# Patient Record
Sex: Male | Born: 1970 | Race: Black or African American | Hispanic: No | Marital: Married | Smoking: Never smoker
Health system: Southern US, Community
[De-identification: ages and names within clinical notes are randomized; demographics above are authoritative.]

## PROBLEM LIST (undated history)

## (undated) DIAGNOSIS — G839 Paralytic syndrome, unspecified: Secondary | ICD-10-CM

## (undated) DIAGNOSIS — Z789 Other specified health status: Secondary | ICD-10-CM

## (undated) HISTORY — PX: NO PAST SURGERIES: SHX2092

---

## 2004-03-27 ENCOUNTER — Emergency Department: Payer: Self-pay | Admitting: Emergency Medicine

## 2005-01-12 ENCOUNTER — Other Ambulatory Visit: Payer: Self-pay

## 2005-01-12 ENCOUNTER — Emergency Department: Payer: Self-pay | Admitting: Unknown Physician Specialty

## 2006-02-14 ENCOUNTER — Emergency Department: Payer: Self-pay | Admitting: Emergency Medicine

## 2006-07-22 ENCOUNTER — Emergency Department: Payer: Self-pay | Admitting: Emergency Medicine

## 2008-10-07 ENCOUNTER — Emergency Department: Payer: Self-pay | Admitting: Internal Medicine

## 2008-10-09 ENCOUNTER — Emergency Department: Payer: Self-pay | Admitting: Emergency Medicine

## 2012-12-13 ENCOUNTER — Emergency Department: Payer: Self-pay | Admitting: Emergency Medicine

## 2012-12-13 LAB — CBC
HCT: 43.1 % (ref 40.0–52.0)
HGB: 14.5 g/dL (ref 13.0–18.0)
MCH: 30.3 pg (ref 26.0–34.0)
MCHC: 33.7 g/dL (ref 32.0–36.0)
Platelet: 174 10*3/uL (ref 150–440)
RBC: 4.8 10*6/uL (ref 4.40–5.90)
RDW: 13.2 % (ref 11.5–14.5)

## 2012-12-13 LAB — CK TOTAL AND CKMB (NOT AT ARMC): CK, Total: 373 U/L — ABNORMAL HIGH (ref 35–232)

## 2012-12-13 LAB — BASIC METABOLIC PANEL
Calcium, Total: 9.1 mg/dL (ref 8.5–10.1)
EGFR (African American): >60
EGFR (Non-African Amer.): >60
Glucose: 111 mg/dL — ABNORMAL HIGH (ref 65–99)
Sodium: 140 mmol/L (ref 136–145)

## 2012-12-14 LAB — URINALYSIS, COMPLETE
Glucose,UR: NEGATIVE mg/dL (ref 0–75)
Ketone: NEGATIVE
Leukocyte Esterase: NEGATIVE
Ph: 5 (ref 4.5–8.0)
Protein: NEGATIVE
RBC,UR: 7 /HPF (ref 0–5)
Specific Gravity: 1.026 (ref 1.003–1.030)
Squamous Epithelial: <1
WBC UR: 1 /HPF (ref 0–5)

## 2012-12-14 LAB — DRUG SCREEN, URINE
Amphetamines, Ur Screen: NEGATIVE (ref ?–1000)
Barbiturates, Ur Screen: NEGATIVE (ref ?–200)
Cannabinoid 50 Ng, Ur ~~LOC~~: POSITIVE (ref ?–50)
Cocaine Metabolite,Ur ~~LOC~~: NEGATIVE (ref ?–300)
MDMA (Ecstasy)Ur Screen: NEGATIVE (ref ?–500)
Methadone, Ur Screen: NEGATIVE (ref ?–300)
Opiate, Ur Screen: NEGATIVE (ref ?–300)

## 2012-12-14 LAB — BASIC METABOLIC PANEL
Anion Gap: 9 (ref 7–16)
Calcium, Total: 7.9 mg/dL — ABNORMAL LOW (ref 8.5–10.1)
Co2: 24 mmol/L (ref 21–32)
Creatinine: 1.24 mg/dL (ref 0.60–1.30)
EGFR (African American): >60
EGFR (Non-African Amer.): >60
Osmolality: 287 (ref 275–301)
Sodium: 142 mmol/L (ref 136–145)

## 2013-11-30 ENCOUNTER — Emergency Department: Payer: Self-pay | Admitting: Emergency Medicine

## 2013-12-02 ENCOUNTER — Emergency Department: Payer: Self-pay | Admitting: Emergency Medicine

## 2013-12-03 LAB — BASIC METABOLIC PANEL
Anion Gap: 7 (ref 7–16)
BUN: 23 mg/dL — ABNORMAL HIGH (ref 7–18)
CALCIUM: 8.9 mg/dL (ref 8.5–10.1)
CO2: 27 mmol/L (ref 21–32)
Chloride: 103 mmol/L (ref 98–107)
Creatinine: 1.06 mg/dL (ref 0.60–1.30)
EGFR (Non-African Amer.): >60
GLUCOSE: 83 mg/dL (ref 65–99)
Osmolality: 277 (ref 275–301)
Potassium: 4.1 mmol/L (ref 3.5–5.1)
Sodium: 137 mmol/L (ref 136–145)

## 2013-12-03 LAB — CBC
HCT: 41.7 % (ref 40.0–52.0)
HGB: 14 g/dL (ref 13.0–18.0)
MCH: 30.9 pg (ref 26.0–34.0)
MCHC: 33.7 g/dL (ref 32.0–36.0)
MCV: 92 fL (ref 80–100)
PLATELETS: 139 10*3/uL — AB (ref 150–440)
RBC: 4.54 10*6/uL (ref 4.40–5.90)
RDW: 13.3 % (ref 11.5–14.5)
WBC: 6.3 10*3/uL (ref 3.8–10.6)

## 2013-12-03 LAB — CK: CK, Total: 1568 U/L — ABNORMAL HIGH

## 2013-12-19 ENCOUNTER — Ambulatory Visit: Payer: Self-pay | Admitting: Unknown Physician Specialty

## 2014-07-05 IMAGING — US US EXTREM LOW VENOUS*R*
1 series · 13 of 24 positions shown · non-contrast
Comparison: None.

CLINICAL DATA: Hamstring injury with swelling.  Pain.



[Series 1: us extrem low venous*right* · 0.07mm/px · 13 of 44 slices shown]
[im 1/44]
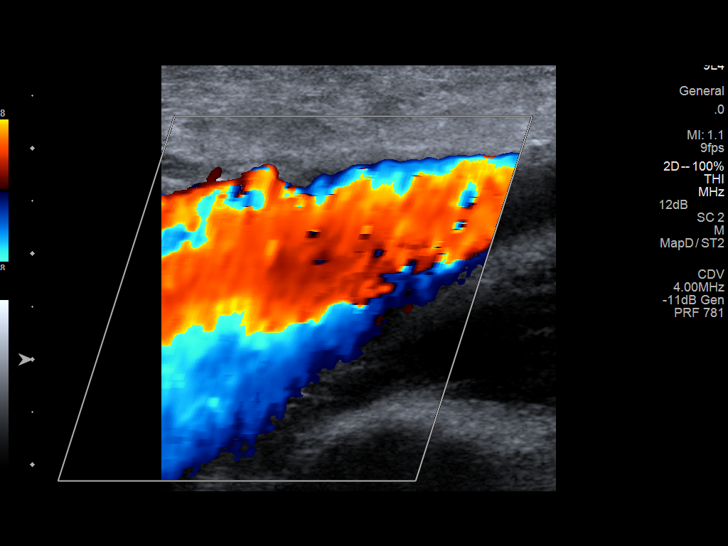
[im 4/44]
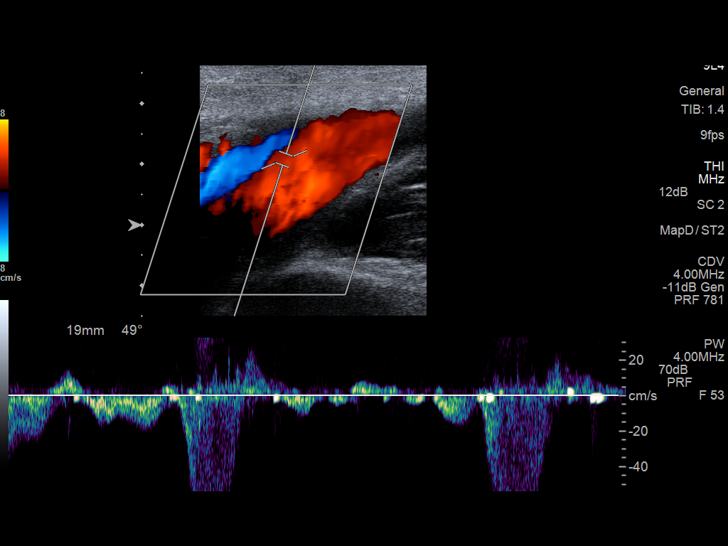
[im 8/44]
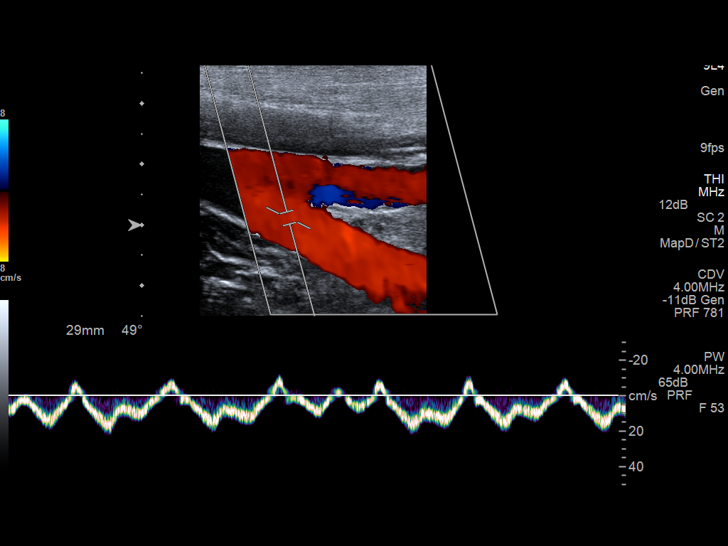
[im 12/44]
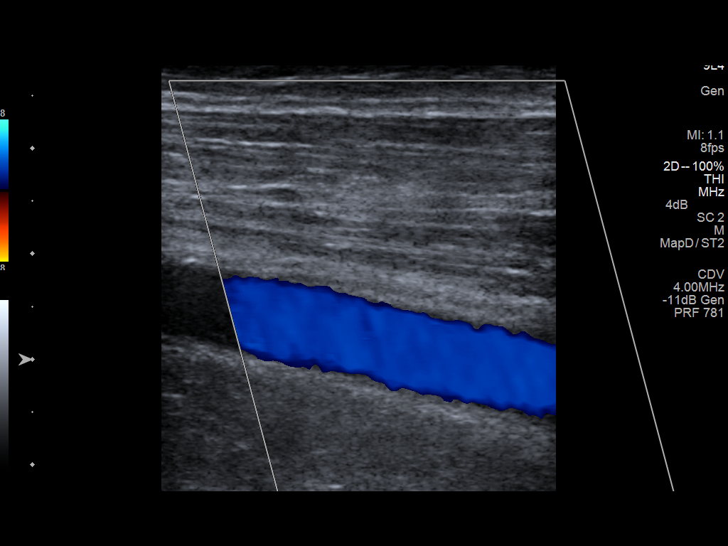
[im 15/44]
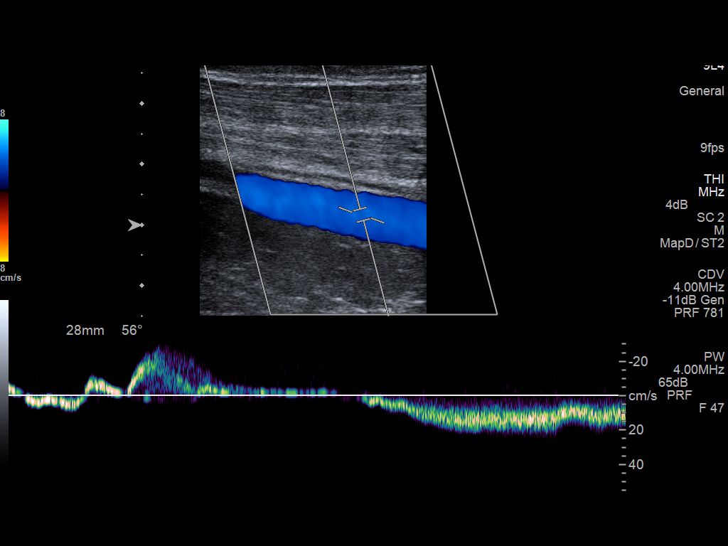
[im 19/44]
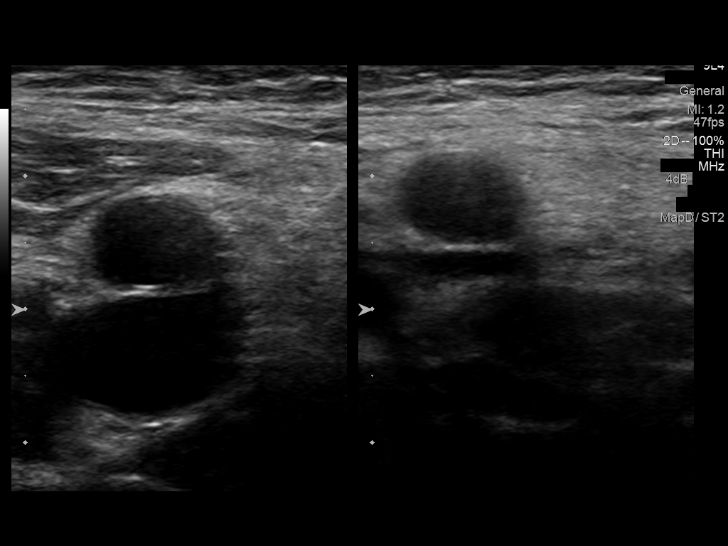
[im 23/44]
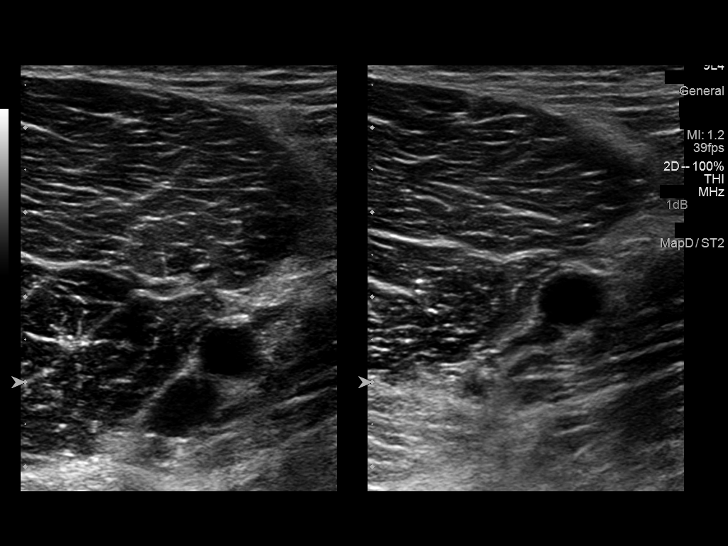
[im 25/44]
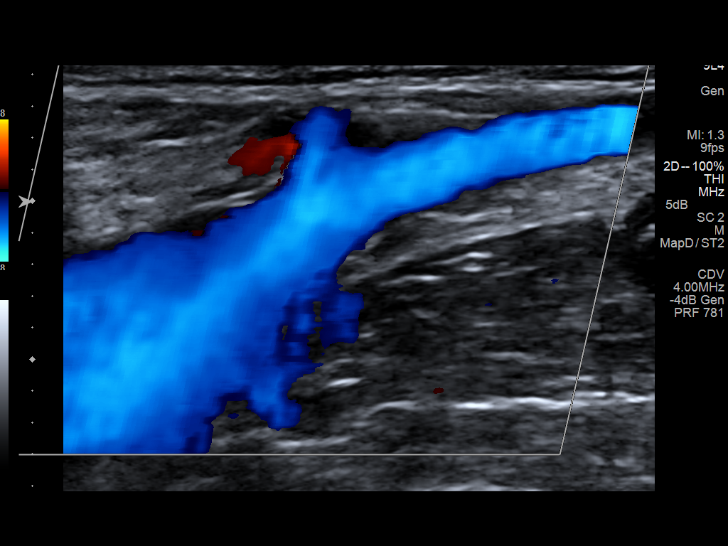
[im 29/44]
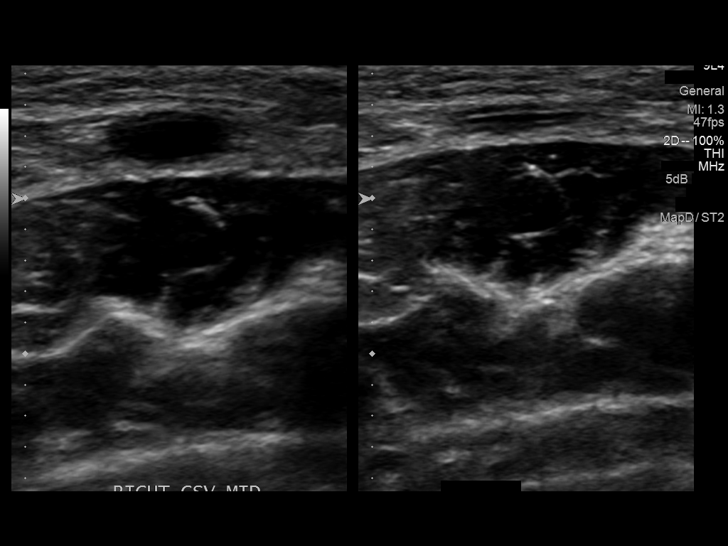
[im 32/44]
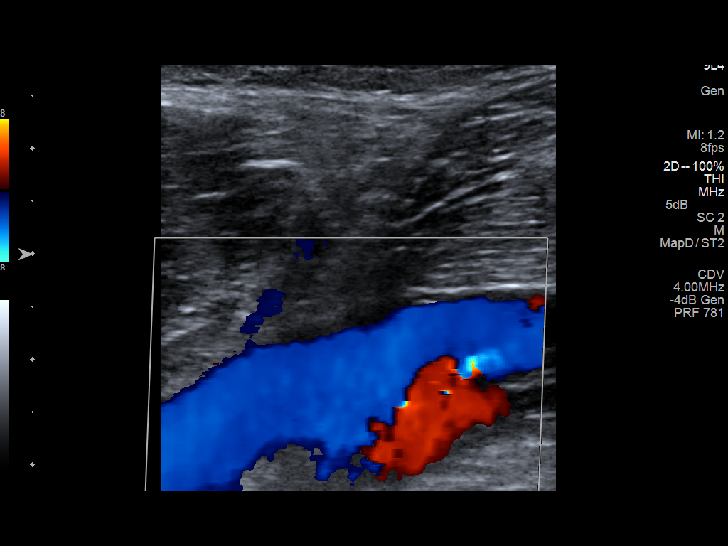
[im 36/44]
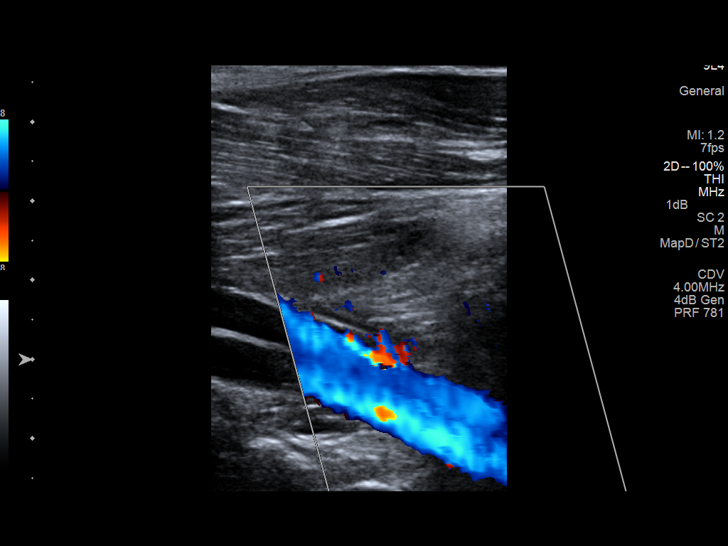
[im 40/44]
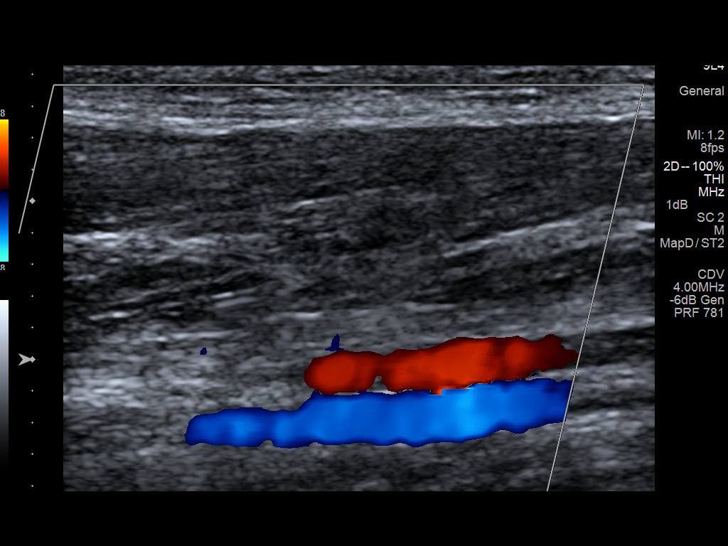
[im 44/44]
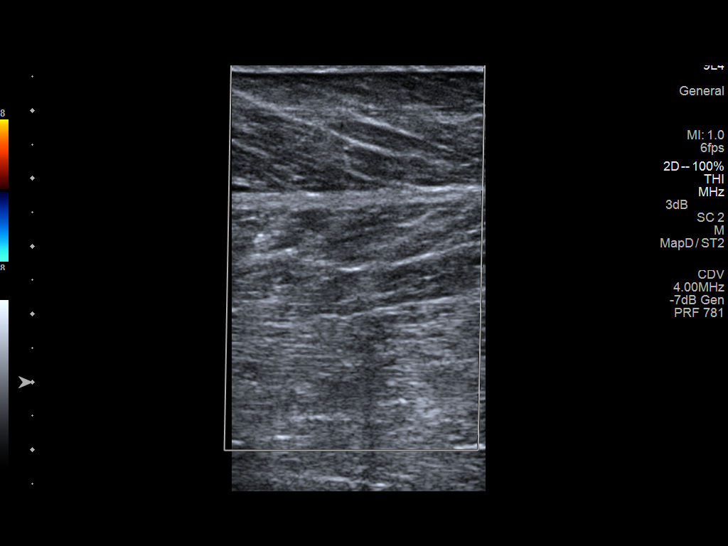

[13 of 24 positions shown; findings below may reference images not displayed]

FINDINGS: Common Femoral Vein: No evidence of thrombus. Normal
compressibility, respiratory phasicity and response to augmentation.

Saphenofemoral Junction: No evidence of thrombus. Normal
compressibility and flow on color Doppler imaging.

Profunda Femoral Vein: No evidence of thrombus. Normal
compressibility and flow on color Doppler imaging.

Femoral Vein: No evidence of thrombus. Normal compressibility,
respiratory phasicity and response to augmentation.

Popliteal Vein: No evidence of thrombus. Normal compressibility,
respiratory phasicity and response to augmentation.

Calf Veins: No evidence of thrombus. Normal compressibility and flow
on color Doppler imaging.

Superficial Great Saphenous Vein: No evidence of thrombus. Normal
compressibility and flow on color Doppler imaging.

Venous Reflux:  None.

Other Findings:  None.
IMPRESSION: No evidence of deep venous thrombosis.

## 2016-01-27 ENCOUNTER — Ambulatory Visit: Payer: Self-pay | Admitting: Podiatry

## 2019-11-12 ENCOUNTER — Ambulatory Visit (INDEPENDENT_AMBULATORY_CARE_PROVIDER_SITE_OTHER): Payer: 59 | Admitting: Podiatry

## 2019-11-12 ENCOUNTER — Encounter: Payer: Self-pay | Admitting: Podiatry

## 2019-11-12 ENCOUNTER — Other Ambulatory Visit: Payer: Self-pay

## 2019-11-12 ENCOUNTER — Encounter: Payer: Self-pay | Admitting: *Deleted

## 2019-11-12 DIAGNOSIS — B353 Tinea pedis: Secondary | ICD-10-CM

## 2019-11-12 DIAGNOSIS — L03119 Cellulitis of unspecified part of limb: Secondary | ICD-10-CM

## 2019-11-12 MED ORDER — CLINDAMYCIN HCL 150 MG PO CAPS: 150.0000 mg | ORAL_CAPSULE | Freq: Three times a day (TID) | ORAL | 1 refills | Status: DC

## 2019-11-12 MED ORDER — TERBINAFINE HCL 250 MG PO TABS: 250.0000 mg | ORAL_TABLET | Freq: Every day | ORAL | 0 refills | Status: DC

## 2019-11-12 NOTE — Progress Notes (Signed)
  Subjective:  Patient ID: Larry Knox, male    DOB: Jun 18, 1970,  MRN: [id number] HPI Chief Complaint  Patient presents with  . Skin Problem    Toes bilateral - scaly, rashy area x 3 weeks, episodes of this happens periodically, tried keeping clean, took clindamycin in the past  . New Patient (Initial Visit)    49 y.o. male presents with the above complaint.   ROS: Denies fever chills nausea vomiting muscle aches pains calf pain back pain chest pain shortness of breath.  No past medical history on file.   Current Outpatient Medications:  .  clindamycin (CLEOCIN) 150 MG capsule, Take 1 capsule (150 mg total) by mouth 3 (three) times daily., Disp: 30 capsule, Rfl: 1 .  terbinafine (LAMISIL) 250 MG tablet, Take 1 tablet (250 mg total) by mouth daily., Disp: 30 tablet, Rfl: 0  No Known Allergies Review of Systems Objective:  There were no vitals filed for this visit.  General: Well developed, nourished, in no acute distress, alert and oriented x3   Dermatological: Skin is warm, dry and supple bilateral. Nails x 10 are well maintained; remaining integument appears unremarkable at this time. There are no open sores, no preulcerative lesions, no rash or signs of infection present.  This appears to be an interdigital tinea pedis bilaterally there also appears to be some blistering associated with it possibly from the fungus but it does have more of a bacterial appearance to it.  The areas are warm to the touch as opposed to the surrounding skin but the majority of it is on the plantar aspect of the foot and the worst of it is between the toes and dorsum of the toes.  Vascular: Dorsalis Pedis artery and Posterior Tibial artery pedal pulses are 2/4 bilateral with immedate capillary fill time. Pedal hair growth present. No varicosities and no lower extremity edema present bilateral.   Neruologic: Grossly intact via light touch bilateral. Vibratory intact via tuning fork bilateral.  Protective threshold with Semmes Wienstein monofilament intact to all pedal sites bilateral. Patellar and Achilles deep tendon reflexes 2+ bilateral. No Babinski or clonus noted bilateral.   Musculoskeletal: No gross boney pedal deformities bilateral. No pain, crepitus, or limitation noted with foot and ankle range of motion bilateral. Muscular strength 5/5 in all groups tested bilateral.  Gait: Unassisted, Nonantalgic.    Radiographs:  None taken  Assessment & Plan:   Assessment: Most likely this is a tinea outbreak with a superimposed bacterial infection.  Plan: We discussed etiology pathology conservative versus surgical therapies.  At this point there was no wet exudate to take a sample left.  Grossly this appears to be bacteria and fungus combined.  At this point I started him on clindamycin and terbinafine.  I will follow-up with him in 1 month.     Larry Sequeira T. [place], [place]

## 2019-11-25 ENCOUNTER — Ambulatory Visit: Payer: 59 | Admitting: Podiatry

## 2019-11-27 ENCOUNTER — Ambulatory Visit (INDEPENDENT_AMBULATORY_CARE_PROVIDER_SITE_OTHER): Payer: 59 | Admitting: Podiatry

## 2019-11-27 ENCOUNTER — Encounter: Payer: Self-pay | Admitting: Podiatry

## 2019-11-27 ENCOUNTER — Other Ambulatory Visit: Payer: Self-pay

## 2019-11-27 DIAGNOSIS — B353 Tinea pedis: Secondary | ICD-10-CM

## 2019-11-27 DIAGNOSIS — L03119 Cellulitis of unspecified part of limb: Secondary | ICD-10-CM

## 2019-11-27 MED ORDER — CLINDAMYCIN HCL 150 MG PO CAPS: 150.0000 mg | ORAL_CAPSULE | Freq: Three times a day (TID) | ORAL | 1 refills | Status: DC

## 2019-11-27 MED ORDER — TERBINAFINE HCL 250 MG PO TABS: 250.0000 mg | ORAL_TABLET | Freq: Every day | ORAL | 0 refills | Status: DC

## 2019-11-28 ENCOUNTER — Encounter: Payer: Self-pay | Admitting: Podiatry

## 2019-11-28 NOTE — Progress Notes (Signed)
  Subjective:  Patient ID: Larry Knox, male    DOB: 21-Dec-1970,  MRN: [id number]  Chief Complaint  Patient presents with  . Foot Problem    both feet are a little better and the medicines seems to be helping    49 y.o. male presents with the above complaint.  Patient presents with a follow-up of dermatological rash/eczema bilateral feet dorsal aspect of the foot.  Patient states is doing little bit better.  Patient states that he was given clindamycin as well as Lamisil by Dr. Al Corpus which seem to have improved a little bit.  Patient states that he has been better since Monday.  His pain has gotten little bit better.  The itching is not as bad as it used to.  Patient has been wearing his or his boots.  He has been try to keep it nice and dry.  He denies any other acute complaints.  He would like to know what his further managements are.   Review of Systems: Negative except as noted in the HPI. Denies N/V/F/Ch.  History reviewed. No pertinent past medical history.  Current Outpatient Medications:  .  clindamycin (CLEOCIN) 150 MG capsule, Take 1 capsule (150 mg total) by mouth 3 (three) times daily., Disp: 30 capsule, Rfl: 1 .  terbinafine (LAMISIL) 250 MG tablet, Take 1 tablet (250 mg total) by mouth daily., Disp: 30 tablet, Rfl: 0  Social History   Tobacco Use  Smoking Status Never Smoker  Smokeless Tobacco Never Used    No Known Allergies Objective:  There were no vitals filed for this visit. There is no height or weight on file to calculate BMI. Constitutional Well developed. Well nourished.  Vascular Dorsalis pedis pulses palpable bilaterally. Posterior tibial pulses palpable bilaterally. Capillary refill normal to all digits.  No cyanosis or clubbing noted. Pedal hair growth normal.  Neurologic Normal speech. Oriented to person, place, and time. Epicritic sensation to light touch grossly present bilaterally.  Dermatologic  xerotic patches of dry subjective itching  skin noted to dorsal foot of the right as well as the dorsal lateral foot on the left side.  Mild raised papules noted.  Orthopedic: Normal joint ROM without pain or crepitus bilaterally. No visible deformities. No bony tenderness.   Radiographs: None Assessment:   1. Tinea pedis of both feet   2. Cellulitis of foot    Plan:  Patient was evaluated and treated and all questions answered.  Tinea outbreak with superimposed bacterial infection improving -I explained the patient the etiology of superimposed bacterial infection in the setting of tinea pedis and various treatment options were discussed.  Patient is doing well on Lamisil clindamycin therapy.  I will continue another month course of Lamisil therapy as well as give him another 10 days of clindamycin.  He will follow back up with Dr. Al Corpus for further management.  Even that this is working we will continue for another month course.  No follow-ups on file.

## 2019-12-17 ENCOUNTER — Other Ambulatory Visit: Payer: Self-pay

## 2019-12-17 ENCOUNTER — Encounter: Payer: Self-pay | Admitting: Podiatry

## 2019-12-17 ENCOUNTER — Ambulatory Visit (INDEPENDENT_AMBULATORY_CARE_PROVIDER_SITE_OTHER): Payer: 59 | Admitting: Podiatry

## 2019-12-17 DIAGNOSIS — B353 Tinea pedis: Secondary | ICD-10-CM | POA: Diagnosis not present

## 2019-12-17 DIAGNOSIS — Z79899 Other long term (current) drug therapy: Secondary | ICD-10-CM | POA: Diagnosis not present

## 2019-12-17 MED ORDER — CLINDAMYCIN PHOSPHATE 1 % EX SOLN
Freq: Two times a day (BID) | CUTANEOUS | 2 refills | Status: DC
Start: 2019-12-17 — End: 2022-09-18

## 2019-12-17 NOTE — Progress Notes (Signed)
He presents today states that he is doing much better than he was he still taking the Lamisil is still taking the oral clindamycin.  Objective: His tinea pedis appears to be resolving very nicely however he still has the digital maceration which appears to be more of a corynebacteria or erythrasma.  Assessment: Tinea pedis and erythrasma.  Plan: Continue the antifungal Lamisil that he has at home and we will start with a liquid clindamycin solution to drop into the toes on to see if that will help.  Hopefully this will help resolve his issues.  I am going to request a liver profile and I will follow-up with him in the near future.  Should this come back abnormal we will discuss that immediately.

## 2019-12-23 LAB — COMPREHENSIVE METABOLIC PANEL
ALT: 30 IU/L (ref 0–44)
AST: 21 IU/L (ref 0–40)
Albumin/Globulin Ratio: 1.7 (ref 1.2–2.2)
Albumin: 4.3 g/dL (ref 4.0–5.0)
Alkaline Phosphatase: 67 IU/L (ref 48–121)
BUN/Creatinine Ratio: 17 (ref 9–20)
BUN: 22 mg/dL (ref 6–24)
Bilirubin Total: 0.5 mg/dL (ref 0.0–1.2)
CO2: 21 mmol/L (ref 20–29)
Calcium: 9.1 mg/dL (ref 8.7–10.2)
Chloride: 104 mmol/L (ref 96–106)
Creatinine, Ser: 1.27 mg/dL (ref 0.76–1.27)
GFR calc Af Amer: 76 mL/min/{1.73_m2} (ref >59)
GFR calc non Af Amer: 65 mL/min/{1.73_m2} (ref >59)
Globulin, Total: 2.5 g/dL (ref 1.5–4.5)
Glucose: 96 mg/dL (ref 65–99)
Potassium: 4.5 mmol/L (ref 3.5–5.2)
Sodium: 138 mmol/L (ref 134–144)
Total Protein: 6.8 g/dL (ref 6.0–8.5)

## 2020-01-01 ENCOUNTER — Encounter: Payer: Self-pay | Admitting: Podiatry

## 2020-01-01 ENCOUNTER — Ambulatory Visit (INDEPENDENT_AMBULATORY_CARE_PROVIDER_SITE_OTHER): Payer: 59 | Admitting: Podiatry

## 2020-01-01 ENCOUNTER — Other Ambulatory Visit: Payer: Self-pay

## 2020-01-01 DIAGNOSIS — Z79899 Other long term (current) drug therapy: Secondary | ICD-10-CM | POA: Diagnosis not present

## 2020-01-01 DIAGNOSIS — B353 Tinea pedis: Secondary | ICD-10-CM

## 2020-01-01 DIAGNOSIS — L309 Dermatitis, unspecified: Secondary | ICD-10-CM | POA: Diagnosis not present

## 2020-01-02 ENCOUNTER — Telehealth: Payer: Self-pay

## 2020-01-02 DIAGNOSIS — L309 Dermatitis, unspecified: Secondary | ICD-10-CM

## 2020-01-02 DIAGNOSIS — B353 Tinea pedis: Secondary | ICD-10-CM

## 2020-01-02 DIAGNOSIS — L03119 Cellulitis of unspecified part of limb: Secondary | ICD-10-CM

## 2020-01-02 NOTE — Progress Notes (Signed)
  Subjective:  Patient ID: Larry Knox, male    DOB: July 05, 1970,  MRN: [id number]  Chief Complaint  Patient presents with  . Tinea Pedis    "its not getting any better, it hurts, swells, drains and is turning redder"    49 y.o. male presents with the above complaint.  Patient presents with a follow-up of of rash/eczema to the bilateral dorsal aspect of the foot with right side is much worse than left side.  There is maceration present.  Patient states the Lamisil or antibiotics is not helping at all.  He would like to know if there is anything else that could be done.  He has failed many prescription creams as well as oral medication.  He denies any other acute complaints.   Review of Systems: Negative except as noted in the HPI. Denies N/V/F/Ch.  No past medical history on file.  Current Outpatient Medications:  .  clindamycin (CLEOCIN T) 1 % external solution, Apply topically 2 (two) times daily., Disp: 60 mL, Rfl: 2 .  terbinafine (LAMISIL) 250 MG tablet, Take 1 tablet (250 mg total) by mouth daily., Disp: 30 tablet, Rfl: 0  Social History   Tobacco Use  Smoking Status Never Smoker  Smokeless Tobacco Never Used    No Known Allergies Objective:  There were no vitals filed for this visit. There is no height or weight on file to calculate BMI. Constitutional Well developed. Well nourished.  Vascular Dorsalis pedis pulses palpable bilaterally. Posterior tibial pulses palpable bilaterally. Capillary refill normal to all digits.  No cyanosis or clubbing noted. Pedal hair growth normal.  Neurologic Normal speech. Oriented to person, place, and time. Epicritic sensation to light touch grossly present bilaterally.  Dermatologic  xerotic patches of dry subjective itching skin noted to dorsal foot of the right as well as the dorsal lateral foot on the left side.  Mild raised papules noted.  Orthopedic: Normal joint ROM without pain or crepitus bilaterally. No visible  deformities. No bony tenderness.   Radiographs: None Assessment:   1. Tinea pedis of both feet   2. Encounter for long-term (current) use of medications   3. Dermatitis    Plan:  Patient was evaluated and treated and all questions answered.  Tinea outbreak with superimposed bacterial infection improving right side much worse than left side -Clinically appears that this is getting worse.  I am worried that there might be underlying component of dermatological manifestation that is not being controlled with antibiotics as well as antifungal such as Lamisil.  Given that this is clinically not improving I believe patient will benefit from follow-up with dermatologist for further evaluation and management. -He will benefit from Betadine wet-to-dry dressing changes as there is more maceration present.  I have asked him to do it 3 times a week. -There may be a component of psoriasis that patient will need to be worked up for.  Patient states understanding he will make an appointment with dermatology.  No follow-ups on file.

## 2020-01-02 NOTE — Telephone Encounter (Signed)
-----   Message from Candelaria Stagers, DPM sent at 01/02/2020  9:00 AM EDT ----- Regarding: Follow-up appointment for dermatology ASAP Hi Angie,  Can you make an appointment for this patient to follow-up with dermatology ASAP.   Thank you

## 2020-01-02 NOTE — Telephone Encounter (Signed)
Referral has been faxed to [facility]

## 2020-01-07 ENCOUNTER — Ambulatory Visit: Payer: 59 | Admitting: Podiatry

## 2020-01-19 ENCOUNTER — Telehealth: Payer: Self-pay

## 2020-01-19 NOTE — Telephone Encounter (Signed)
[facility] to follow up on patient's appt.  Per Noreene Larsson, on 01/13/2020 patient stated that he no longer wants the appt.

## 2020-01-21 ENCOUNTER — Ambulatory Visit: Payer: 59 | Admitting: Podiatry

## 2022-09-18 ENCOUNTER — Ambulatory Visit: Payer: 59 | Admitting: Nurse Practitioner

## 2022-09-18 ENCOUNTER — Other Ambulatory Visit: Payer: Self-pay

## 2022-09-18 ENCOUNTER — Telehealth: Payer: Self-pay

## 2022-09-18 ENCOUNTER — Encounter: Payer: Self-pay | Admitting: Nurse Practitioner

## 2022-09-18 VITALS — BP 120/72 | HR 70 | Temp 97.8°F | Resp 16 | Ht 69.25 in | Wt 200.1 lb

## 2022-09-18 DIAGNOSIS — E663 Overweight: Secondary | ICD-10-CM | POA: Diagnosis not present

## 2022-09-18 DIAGNOSIS — Z0000 Encounter for general adult medical examination without abnormal findings: Secondary | ICD-10-CM

## 2022-09-18 DIAGNOSIS — Z125 Encounter for screening for malignant neoplasm of prostate: Secondary | ICD-10-CM

## 2022-09-18 DIAGNOSIS — Z1211 Encounter for screening for malignant neoplasm of colon: Secondary | ICD-10-CM

## 2022-09-18 DIAGNOSIS — Z23 Encounter for immunization: Secondary | ICD-10-CM | POA: Diagnosis not present

## 2022-09-18 LAB — LIPID PANEL
Cholesterol: 235 mg/dL — ABNORMAL HIGH (ref 0–200)
HDL: 60.2 mg/dL (ref >39.00)
LDL Cholesterol: 152 mg/dL — ABNORMAL HIGH (ref 0–99)
NonHDL: 175.04
Total CHOL/HDL Ratio: 4
Triglycerides: 113 mg/dL (ref 0.0–149.0)
VLDL: 22.6 mg/dL (ref 0.0–40.0)

## 2022-09-18 LAB — COMPREHENSIVE METABOLIC PANEL
ALT: 50 U/L (ref 0–53)
AST: 33 U/L (ref 0–37)
Albumin: 4.5 g/dL (ref 3.5–5.2)
Alkaline Phosphatase: 59 U/L (ref 39–117)
BUN: 16 mg/dL (ref 6–23)
CO2: 28 mEq/L (ref 19–32)
Calcium: 9.3 mg/dL (ref 8.4–10.5)
Chloride: 103 mEq/L (ref 96–112)
Creatinine, Ser: 1.22 mg/dL (ref 0.40–1.50)
GFR: 67.98 mL/min (ref >60.00)
Glucose, Bld: 80 mg/dL (ref 70–99)
Potassium: 4.2 mEq/L (ref 3.5–5.1)
Sodium: 139 mEq/L (ref 135–145)
Total Bilirubin: 0.9 mg/dL (ref 0.2–1.2)
Total Protein: 6.6 g/dL (ref 6.0–8.3)

## 2022-09-18 LAB — TSH: TSH: 1.64 u[IU]/mL (ref 0.35–5.50)

## 2022-09-18 LAB — CBC
HCT: 45.6 % (ref 39.0–52.0)
Hemoglobin: 15.6 g/dL (ref 13.0–17.0)
MCHC: 34.3 g/dL (ref 30.0–36.0)
MCV: 90.2 fl (ref 78.0–100.0)
Platelets: 164 10*3/uL (ref 150.0–400.0)
RBC: 5.06 Mil/uL (ref 4.22–5.81)
RDW: 13.1 % (ref 11.5–15.5)
WBC: 4.2 10*3/uL (ref 4.0–10.5)

## 2022-09-18 LAB — PSA: PSA: 1.07 ng/mL (ref 0.10–4.00)

## 2022-09-18 MED ORDER — NA SULFATE-K SULFATE-MG SULF 17.5-3.13-1.6 GM/177ML PO SOLN: 1.0000 | Freq: Once | ORAL | 0 refills | Status: AC

## 2022-09-18 NOTE — Assessment & Plan Note (Signed)
Discussed age-appropriate immunizations and screening exams.  Patient is up-to-date on vaccinations.  First shingles vaccine is due today in office.  Did review patient's personal, surgical, social, family history.  Ambulatory referral placed for CRC screening.  DRE and PSA done today in office per patient request.  Patient was given information at discharge about preventative healthcare maintenance with anticipatory guidance.

## 2022-09-18 NOTE — Patient Instructions (Signed)
Nice to see you today I will be in touch with the labs once I have them Follow up with me in 1 year for your next physical and labs Make a nurse visit in 3 months for your second shingles vaccine  Make an appointment with Dr. Karleen Hampshire Copland about the shoulder and hip

## 2022-09-18 NOTE — Progress Notes (Signed)
New Patient Office Visit  Subjective    Patient ID: Larry Logearion Welby Sliker, male    DOB: [birth year]  Age: 52 y.o. MRN: [id number]  CC:  Chief Complaint  Patient presents with   Establish Care   Annual Exam    HPI Larry Knox presents to establish care   for complete physical and follow up of chronic conditions.  Immunizations: -Tetanus: Completed in within 10 years -Influenza: refused -Shingles: First vaccine today -Pneumonia: Too young -covid: Radiographer, therapeuticpfizer x2  Diet: Fair diet. States that he eats approx 2-3 meals a day. State breakfast for sure. Light lunch and dinner. States that he does snack. States that he will do black coffee, green tea, water and body armor  Exercise: No regular exercise. States on the weekends he will do bike treadmill and lifting weights   Eye exam: Completes annually. Glasses   Dental exam: has appt coming up in June   Colonoscopy: needs referral Lung Cancer Screening: N/A  PSA: Due  Sleep: states that he goes to bed around 9pm and will sleep 6-8 hours. Feels rested. Does not snore     Outpatient Encounter Medications as of 09/18/2022  Medication Sig   [DISCONTINUED] clindamycin (CLEOCIN T) 1 % external solution Apply topically 2 (two) times daily. (Patient not taking: Reported on 09/18/2022)   [DISCONTINUED] terbinafine (LAMISIL) 250 MG tablet Take 1 tablet (250 mg total) by mouth daily. (Patient not taking: Reported on 09/18/2022)   No facility-administered encounter medications on file as of 09/18/2022.    History reviewed. No pertinent past medical history.  History reviewed. No pertinent surgical history.  Family History  Problem Relation Age of Onset   Diabetes Mother    Heart disease Father    Diabetes Father    Diabetes Maternal Grandmother    Heart disease Paternal Grandmother    Heart disease Paternal Grandfather     Social History   Socioeconomic History   Marital status: Married    Spouse name: Danielle   Number of  children: 1   Years of education: Not on file   Highest education level: Not on file  Occupational History   Not on file  Tobacco Use   Smoking status: Never   Smokeless tobacco: Never  Vaping Use   Vaping Use: Never used  Substance and Sexual Activity   Alcohol use: Yes    Alcohol/week: 2.0 standard drinks of alcohol    Types: 2 Shots of liquor per week    Comment: bourbon on the weekends 2-3 shots over the weekend   Drug use: Never   Sexual activity: Yes    Birth control/protection: None  Other Topics Concern   Not on file  Social History Narrative   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  Respiratory:  Negative for shortness of breath.   Cardiovascular:  Negative for chest pain and leg swelling.  Gastrointestinal:  Negative for abdominal pain, blood in stool, constipation, diarrhea, nausea and vomiting.       Bm daily   Genitourinary:  Negative for dysuria and  hematuria.       Nocturia 1-2   Neurological:  Negative for tingling and headaches.  Psychiatric/Behavioral:  Negative for hallucinations and suicidal ideas.         Objective    BP 120/72   Pulse 70   Temp 97.8 F (36.6 C)   Resp 16   Ht 5' 9.25" (1.759 m)   Wt 200 lb 2 oz (90.8 kg)   SpO2 97%   BMI 29.34 kg/m   Physical Exam Vitals and nursing note reviewed. Exam conducted with a chaperone present Tresa Endo Fiscal, CMA).  Constitutional:      Appearance: Normal appearance.  HENT:     Right Ear: Tympanic membrane, ear canal and external ear normal.     Left Ear: Tympanic membrane, ear canal and external ear normal.      Mouth/Throat:     Mouth: Mucous membranes are moist.     Pharynx: Oropharynx is clear.  Eyes:     Extraocular Movements: Extraocular movements intact.     Pupils: Pupils are equal, round, and reactive to light.  Cardiovascular:     Rate and Rhythm: Normal rate and regular rhythm.     Pulses: Normal pulses.     Heart sounds: Normal heart sounds.  Pulmonary:     Effort: Pulmonary effort is normal.     Breath sounds: Normal breath sounds.  Abdominal:     General: Bowel sounds are normal. There is no distension.     Palpations: There is no mass.     Tenderness: There is no abdominal tenderness.     Hernia: No hernia is present.  Genitourinary:    Prostate: Normal.     Rectum: Normal.  Musculoskeletal:     Right lower leg: No edema.     Left lower leg: No edema.  Lymphadenopathy:     Cervical: No cervical adenopathy.  Skin:    General: Skin is warm.  Neurological:     General: No focal deficit present.     Mental Status: He is alert.     Deep Tendon Reflexes:     Reflex Scores:      Bicep reflexes are 2+ on the right side and 2+ on the left side.      Patellar reflexes are 2+ on the right side and 2+ on the left side.    Comments: Bilateral upper and lower extremity strength 5/5  Psychiatric:        Mood and Affect: Mood normal.        Behavior: Behavior normal.        Thought Content: Thought content normal.        Judgment: Judgment normal.         Assessment & Plan:   Problem List Items Addressed This Visit       Other   Preventative health care - Primary    Discussed age-appropriate immunizations and screening exams.  Patient is up-to-date on vaccinations.  First shingles vaccine is due today in office.  Did review patient's personal, surgical, social, family history.  Ambulatory referral placed for CRC screening.  DRE and PSA done today in office per patient request.  Patient was given information at discharge about preventative healthcare maintenance with  anticipatory guidance.      Relevant Orders   CBC   Comprehensive metabolic panel   TSH   Overweight    Patient seems to be in great shape do think BMI elevated due to muscle mass.  Continue with  lifestyle modifications as it is      Relevant Orders   Lipid panel   Other Visit Diagnoses     Screening for colon cancer       Relevant Orders   Ambulatory referral to Gastroenterology   Screening for prostate cancer       Relevant Orders   PSA   Need for shingles vaccine       Relevant Orders   Zoster Recombinant (Shingrix ) (Completed)       Return in about 1 year (around 09/18/2023) for CPE and Labs.   Audria Nine, NP

## 2022-09-18 NOTE — Telephone Encounter (Signed)
Gastroenterology Pre-Procedure Review  Request Date: 10/16/22 Requesting Physician: Dr. Allegra Lai  PATIENT REVIEW QUESTIONS: The patient responded to the following health history questions as indicated:    1. Are you having any GI issues? no 2. Do you have a personal history of Polyps? no 3. Do you have a family history of Colon Cancer or Polyps? no 4. Diabetes Mellitus? no 5. Joint replacements in the past 12 months?no 6. Major health problems in the past 3 months?no 7. Any artificial heart valves, MVP, or defibrillator?no    MEDICATIONS & ALLERGIES:    Patient reports the following regarding taking any anticoagulation/antiplatelet therapy:   Plavix, Coumadin, Eliquis, Xarelto, Lovenox, Pradaxa, Brilinta, or Effient? no Aspirin? no  Patient confirms/reports the following medications:  No current outpatient medications on file.   No current facility-administered medications for this visit.    Patient confirms/reports the following allergies:  No Known Allergies  No orders of the defined types were placed in this encounter.   AUTHORIZATION INFORMATION Primary Insurance: 1D#: Group #:  Secondary Insurance: 1D#: Group #:  SCHEDULE INFORMATION: Date: 10/16/22 Time: Location: [facility]

## 2022-09-18 NOTE — Assessment & Plan Note (Signed)
Patient seems to be in great shape do think BMI elevated due to muscle mass.  Continue with lifestyle modifications as it is

## 2022-09-25 ENCOUNTER — Telehealth: Payer: Self-pay

## 2022-09-25 NOTE — Telephone Encounter (Signed)
Pt left message to cancel and reschedule colonoscopy ?

## 2022-09-25 NOTE — Telephone Encounter (Signed)
Returned patients call. LVM for pt to return my call.   Thanks,  Ryzen Deady, CMA 

## 2022-09-25 NOTE — Telephone Encounter (Signed)
Spoken to patient and we have reschedule to 11/13/2022 as requested.  New instructions will be sent.  Patient verbalized understanding.

## 2022-09-25 NOTE — Telephone Encounter (Signed)
Message left for patient to return my call.  

## 2022-11-10 ENCOUNTER — Encounter: Payer: Self-pay | Admitting: Gastroenterology

## 2022-11-13 ENCOUNTER — Ambulatory Visit
Admission: RE | Admit: 2022-11-13 | Discharge: 2022-11-13 | Disposition: A | Discharge status: Home / Self Care | Payer: 59 | Source: Ambulatory Visit | Attending: Gastroenterology | Admitting: Gastroenterology

## 2022-11-13 ENCOUNTER — Ambulatory Visit: Payer: 59 | Admitting: Anesthesiology

## 2022-11-13 ENCOUNTER — Other Ambulatory Visit: Payer: Self-pay

## 2022-11-13 ENCOUNTER — Encounter
Admission: RE | Disposition: A | Discharge status: Home / Self Care | Payer: Self-pay | Source: Ambulatory Visit | Attending: Gastroenterology

## 2022-11-13 ENCOUNTER — Encounter: Payer: Self-pay | Admitting: Gastroenterology

## 2022-11-13 DIAGNOSIS — Z1211 Encounter for screening for malignant neoplasm of colon: Secondary | ICD-10-CM | POA: Diagnosis not present

## 2022-11-13 DIAGNOSIS — K635 Polyp of colon: Secondary | ICD-10-CM | POA: Diagnosis not present

## 2022-11-13 DIAGNOSIS — D125 Benign neoplasm of sigmoid colon: Secondary | ICD-10-CM | POA: Diagnosis not present

## 2022-11-13 DIAGNOSIS — Z7689 Persons encountering health services in other specified circumstances: Secondary | ICD-10-CM | POA: Diagnosis not present

## 2022-11-13 HISTORY — PX: COLONOSCOPY WITH PROPOFOL: SHX5780

## 2022-11-13 HISTORY — DX: Paralytic syndrome, unspecified: G83.9

## 2022-11-13 HISTORY — DX: Other specified health status: Z78.9

## 2022-11-13 SURGERY — COLONOSCOPY WITH PROPOFOL
Anesthesia: General

## 2022-11-13 MED ORDER — LIDOCAINE HCL (CARDIAC) PF 100 MG/5ML IV SOSY
PREFILLED_SYRINGE | INTRAVENOUS | Status: DC | PRN
  Administered 2022-11-13: 100 mg via INTRAVENOUS

## 2022-11-13 MED ORDER — PROPOFOL 500 MG/50ML IV EMUL
INTRAVENOUS | Status: DC | PRN
  Administered 2022-11-13: 220.507 ug/kg/min via INTRAVENOUS

## 2022-11-13 MED ORDER — PROPOFOL 10 MG/ML IV BOLUS
INTRAVENOUS | Status: DC | PRN
  Administered 2022-11-13: 100 mg via INTRAVENOUS

## 2022-11-13 MED ORDER — SODIUM CHLORIDE 0.9 % IV SOLN: INTRAVENOUS | Status: DC

## 2022-11-13 NOTE — Op Note (Signed)
[facility] Gastroenterology Patient Name: Larry Knox Procedure Date: 11/13/2022 8:26 AM MRN: [id number] Account #: [id number] Date of Birth: Mar 31, 1971 Admit Type: Inpatient Age: 52 Room: [facility] ENDO ROOM 4 Gender: Male Note Status: Finalized Instrument Name: Nelda Marseille [id number] Procedure:             Colonoscopy Indications:           Screening for colorectal malignant neoplasm, This is                         the patient's first colonoscopy Providers:             Toney Reil MD, MD Referring MD:          Genene Churn. Toney Reil (Referring MD) Medicines:             General Anesthesia Complications:         No immediate complications. Estimated blood loss: None. Procedure:             Pre-Anesthesia Assessment:                        - Prior to the procedure, a History and Physical was                         performed, and patient medications and allergies were                         reviewed. The patient is competent. The risks and                         benefits of the procedure and the sedation options and                         risks were discussed with the patient. All questions                         were answered and informed consent was obtained.                         Patient identification and proposed procedure were                         verified by the physician, the nurse, the                         anesthesiologist, the anesthetist and the technician                         in the pre-procedure area in the procedure room in the                         endoscopy suite. Mental Status Examination: alert and                         oriented. Airway Examination: normal oropharyngeal                         airway and neck mobility. Respiratory Examination:  clear to auscultation. CV Examination: normal.                         Prophylactic Antibiotics: The patient does not require                         prophylactic  antibiotics. Prior Anticoagulants: The                         patient has taken no anticoagulant or antiplatelet                         agents. ASA Grade Assessment: I - A normal, healthy                         patient. After reviewing the risks and benefits, the                         patient was deemed in satisfactory condition to                         undergo the procedure. The anesthesia plan was to use                         general anesthesia. Immediately prior to                         administration of medications, the patient was                         re-assessed for adequacy to receive sedatives. The                         heart rate, respiratory rate, oxygen saturations,                         blood pressure, adequacy of pulmonary ventilation, and                         response to care were monitored throughout the                         procedure. The physical status of the patient was                         re-assessed after the procedure.                        After obtaining informed consent, the colonoscope was                         passed under direct vision. Throughout the procedure,                         the patient's blood pressure, pulse, and oxygen                         saturations were monitored continuously. The  Colonoscope was introduced through the anus and                         advanced to the the terminal ileum, with                         identification of the appendiceal orifice and IC                         valve. The colonoscopy was performed without                         difficulty. The patient tolerated the procedure well.                         The quality of the bowel preparation was evaluated                         using the BBPS [facility] Bowel Preparation Scale) with                         scores of: Right Colon = 3, Transverse Colon = 3 and                         Left Colon = 3 (entire mucosa seen  well with no                         residual staining, small fragments of stool or opaque                         liquid). The total BBPS score equals 9. Findings:      The perianal and digital rectal examinations were normal. Pertinent       negatives include normal sphincter tone and no palpable rectal lesions.      Two sessile polyps were found in the sigmoid colon and transverse colon.       The polyps were 3 to 5 mm in size. These polyps were removed with a cold       snare. Resection and retrieval were complete.      The retroflexed view of the distal rectum and anal verge was normal and       showed no anal or rectal abnormalities. Impression:            - Two 3 to 5 mm polyps in the sigmoid colon and in the                         transverse colon, removed with a cold snare. Resected                         and retrieved.                        - The distal rectum and anal verge are normal on                         retroflexion view. Recommendation:        - Discharge patient to home (with escort).                        -  Resume previous diet today.                        - Continue present medications.                        - Await pathology results.                        - Repeat colonoscopy in 5 years for surveillance. Procedure Code(s):     --- Professional ---                        [phone], Colonoscopy, flexible; with removal of                         tumor(s), polyp(s), or other lesion(s) by snare                         technique Diagnosis Code(s):     --- Professional ---                        Z12.11, Encounter for screening for malignant neoplasm                         of colon                        D12.5, Benign neoplasm of sigmoid colon                        D12.3, Benign neoplasm of transverse colon (hepatic                         flexure or splenic flexure) CPT copyright 2022 American Medical Association. All rights reserved. The codes documented in this  report are preliminary and upon coder review may  be revised to meet current compliance requirements. Dr. Libby Maw Toney Reil MD, MD 11/13/2022 8:58:10 AM This report has been signed electronically. Number of Addenda: 0 Note Initiated On: 11/13/2022 8:26 AM Scope Withdrawal Time: 0 hours 16 minutes 57 seconds  Total Procedure Duration: 0 hours 21 minutes 46 seconds  Estimated Blood Loss:  Estimated blood loss: none.      [facility]

## 2022-11-13 NOTE — Anesthesia Postprocedure Evaluation (Signed)
Anesthesia Post Note  Patient: Larry Knox  Procedure(s) Performed: COLONOSCOPY WITH PROPOFOL  Patient location during evaluation: PACU Anesthesia Type: General Level of consciousness: awake and alert Pain management: pain level controlled Vital Signs Assessment: post-procedure vital signs reviewed and stable Respiratory status: spontaneous breathing, nonlabored ventilation and respiratory function stable Cardiovascular status: blood pressure returned to baseline and stable Postop Assessment: no apparent nausea or vomiting Anesthetic complications: no   No notable events documented.   Last Vitals:  Vitals:   11/13/22 0907 11/13/22 0917  BP: 108/87 (!) 118/94  Pulse: 77 66  Resp: (!) 21 14  Temp:    SpO2: 97% 98%    Last Pain:  Vitals:   11/13/22 0857  TempSrc: Temporal  PainSc: Asleep                 Foye Deer

## 2022-11-13 NOTE — Transfer of Care (Signed)
Immediate Anesthesia Transfer of Care Note  Patient: Larry Knox  Procedure(s) Performed: COLONOSCOPY WITH PROPOFOL  Patient Location: Endoscopy Unit  Anesthesia Type:General  Level of Consciousness: drowsy  Airway & Oxygen Therapy: Patient Spontanous Breathing  Post-op Assessment: Report given to RN and Post -op Vital signs reviewed and stable  Post vital signs: Reviewed and stable  Last Vitals:  Vitals Value Taken Time  BP 122/74 11/13/22 0857  Temp 35.6 C 11/13/22 0857  Pulse 81 11/13/22 0857  Resp 11 11/13/22 0857  SpO2 96 % 11/13/22 0857    Last Pain:  Vitals:   11/13/22 0857  TempSrc: Temporal  PainSc:          Complications: No notable events documented.

## 2022-11-13 NOTE — H&P (Signed)
  Arlyss Repress, MD [address]  [address]  [place], [place] [address]  Main: [phone]  Fax: [phone] Pager: [phone]  Primary Care Physician:  Eden Emms, NP Primary Gastroenterologist:  Dr. Arlyss Repress  Pre-Procedure History & Physical: HPI:  Larry Knox is a 52 y.o. male is here for an colonoscopy.   Past Medical History:  Diagnosis Date   Medical history non-contributory     Past Surgical History:  Procedure Laterality Date   NO PAST SURGERIES      Prior to Admission medications   Not on File    Allergies as of 09/18/2022   (No Known Allergies)    Family History  Problem Relation Age of Onset   Diabetes Mother    Heart disease Father    Diabetes Father    Diabetes Maternal Grandmother    Heart disease Paternal Grandmother    Heart disease Paternal Grandfather     Social History   Socioeconomic History   Marital status: Married    Spouse name: Danielle   Number of children: 1   Years of education: Not on file   Highest education level: Not on file  Occupational History   Not on file  Tobacco Use   Smoking status: Never   Smokeless tobacco: Never  Vaping Use   Vaping Use: Never used  Substance and Sexual Activity   Alcohol use: Yes    Alcohol/week: 2.0 standard drinks of alcohol    Types: 2 Shots of liquor per week    Comment: bourbon on the weekends 2-3 shots over the weekend   Drug use: Never   Sexual activity: Yes    Birth control/protection: None  Other Topics Concern   Not on file  Social History Narrative   Fulltime: truck Hospital doctor      [place] (25)      Hobbies:Fishing, playstation    Social Determinants of Health   Financial Resource Strain: Not on file  Food Insecurity: Not on file  Transportation Needs: Not on file  Physical Activity: Not on file  Stress: Not on file  Social Connections: Not on file  Intimate Partner Violence: Not on file    Review of Systems: See HPI, otherwise negative  ROS  Physical Exam: BP (!) 146/100   Pulse (!) 54   Temp (!) 97 F (36.1 C) (Temporal)   Resp 16   Ht 5\' 10"  (1.778 m)   Wt 90.7 kg   SpO2 100%   BMI 28.70 kg/m  General:   Alert,  pleasant and cooperative in NAD Head:  Normocephalic and atraumatic. Neck:  Supple; no masses or thyromegaly. Lungs:  Clear throughout to auscultation.    Heart:  Regular rate and rhythm. Abdomen:  Soft, nontender and nondistended. Normal bowel sounds, without guarding, and without rebound.   Neurologic:  Alert and  oriented x4;  grossly normal neurologically.  Impression/Plan: Jackey Loge is here for an colonoscopy to be performed for colon cancer screening  Risks, benefits, limitations, and alternatives regarding  colonoscopy have been reviewed with the patient.  Questions have been answered.  All parties agreeable.   Lannette Donath, MD  11/13/2022, 7:50 AM

## 2022-11-13 NOTE — Anesthesia Preprocedure Evaluation (Addendum)
Anesthesia Evaluation  Patient identified by MRN, date of birth, ID band Patient awake    Reviewed: Allergy & Precautions, H&P , NPO status , Patient's Chart, lab work & pertinent test results  Airway Mallampati: II  TM Distance: >3 FB Neck ROM: full    Dental  (+) Partial Upper   Pulmonary neg pulmonary ROS   Pulmonary exam normal        Cardiovascular negative cardio ROS Normal cardiovascular exam     Neuro/Psych negative neurological ROS  negative psych ROS   GI/Hepatic negative GI ROS, Neg liver ROS,,,  Endo/Other  negative endocrine ROS    Renal/GU negative Renal ROS  negative genitourinary   Musculoskeletal   Abdominal Normal abdominal exam  (+)   Peds  Hematology negative hematology ROS (+)   Anesthesia Other Findings       Reproductive/Obstetrics negative OB ROS                             Anesthesia Physical Anesthesia Plan  ASA: 1  Anesthesia Plan: General   Post-op Pain Management:    Induction: Intravenous  PONV Risk Score and Plan: Propofol infusion and TIVA  Airway Management Planned: Natural Airway  Additional Equipment:   Intra-op Plan:   Post-operative Plan:   Informed Consent: I have reviewed the patients History and Physical, chart, labs and discussed the procedure including the risks, benefits and alternatives for the proposed anesthesia with the patient or authorized representative who has indicated his/her understanding and acceptance.     Dental Advisory Given  Plan Discussed with: CRNA and Surgeon  Anesthesia Plan Comments:         Anesthesia Quick Evaluation

## 2022-11-14 ENCOUNTER — Encounter: Payer: Self-pay | Admitting: Gastroenterology

## 2022-12-19 ENCOUNTER — Ambulatory Visit (INDEPENDENT_AMBULATORY_CARE_PROVIDER_SITE_OTHER): Payer: 59

## 2022-12-19 DIAGNOSIS — Z23 Encounter for immunization: Secondary | ICD-10-CM

## 2022-12-19 NOTE — Progress Notes (Signed)
Per orders of Mayra Reel, DPN AGNP-C, injection of Shingrix #2 given by Dorris Fetch in left deltoid. Patient tolerated injection. VIS given to patient in MyChart and [facility] updated

## 2023-10-15 ENCOUNTER — Ambulatory Visit (INDEPENDENT_AMBULATORY_CARE_PROVIDER_SITE_OTHER): Admitting: Nurse Practitioner

## 2023-10-15 ENCOUNTER — Inpatient Hospital Stay: Admitting: Family

## 2023-10-15 ENCOUNTER — Encounter: Payer: Self-pay | Admitting: Nurse Practitioner

## 2023-10-15 VITALS — BP 124/92 | HR 98 | Temp 98.1°F | Ht 70.0 in | Wt 198.8 lb

## 2023-10-15 DIAGNOSIS — Z23 Encounter for immunization: Secondary | ICD-10-CM

## 2023-10-15 DIAGNOSIS — R109 Unspecified abdominal pain: Secondary | ICD-10-CM | POA: Diagnosis not present

## 2023-10-15 DIAGNOSIS — Z09 Encounter for follow-up examination after completed treatment for conditions other than malignant neoplasm: Secondary | ICD-10-CM | POA: Insufficient documentation

## 2023-10-15 NOTE — Progress Notes (Signed)
 Acute Office Visit  Subjective:     Patient ID: Larry Knox, male    DOB: 01/27/1971, 53 y.o.   MRN: [id number]  Chief Complaint  Patient presents with   Hospitalization Follow-up    Pt complains of feeling better states he the kidney stone passed  last night.  Pt complains of lower back soreness on left side.    Immunizations    Tdap     HPI Patient is in today for hosptialzatoin follow up   Patient was seen in the ED on 10/09/03 for a right ureteral stone. He was brought in via EMS for right sided flank pain . He did undergo a CT abdomen and pelvis. It showed a 4.34mm tiny stone in the right UVJ with hydronephrosis and hydroyureter.  It did show multiple other renal calculi in both kindens    States that he has been doing ok. States that he passed the stone last night. He di have a BM today that was normal.   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  Please schedule for routine.  States that on 10/09/2023 was his last day of work and he is still out with anticipation of returning on 10/16/2023.  Patient did bring a return to work form following FMLA.  Review of Systems  Constitutional:  Negative for chills and fever.  Respiratory:  Negative for shortness of breath.   Cardiovascular:  Negative for chest pain.  Gastrointestinal:  Negative for abdominal pain, constipation, diarrhea, nausea and vomiting.  Genitourinary:  Positive for flank pain.  Neurological:  Negative for headaches.        Objective:    BP (!) 124/92   Pulse 98   Temp 98.1 F (36.7 C) (Oral)   Ht 5\' 10"  (1.778 m)   Wt 198 lb 12.8 oz (90.2 kg)   SpO2 98%   BMI 28.52 kg/m  BP Readings from Last 3 Encounters:  10/15/23 (!) 124/92  11/13/22 (!) 118/94  09/18/22 120/72   Wt Readings from Last 3 Encounters:  10/15/23 198 lb 12.8 oz (90.2 kg)  11/13/22 200 lb (90.7 kg)  09/18/22 200 lb 2 oz (90.8 kg)   SpO2 Readings from Last 3 Encounters:  10/15/23 98%   11/13/22 98%  09/18/22 97%      Physical Exam Vitals and nursing note reviewed.  Constitutional:      Appearance: Normal appearance.  Cardiovascular:     Rate and Rhythm: Normal rate and regular rhythm.     Heart sounds: Normal heart sounds.  Pulmonary:     Effort: Pulmonary effort is normal.     Breath sounds: Normal breath sounds.  Abdominal:     General: Bowel sounds are normal. There is no distension.     Palpations: There is no mass.     Tenderness: There is no abdominal tenderness. There is right CVA tenderness and guarding.     Hernia: No hernia is present.  Neurological:     Mental Status: He is alert.     No results found for any visits on 10/15/23.      Assessment & Plan:   Problem List Items Addressed This Visit       Other   Hospital discharge follow-up - Primary   Reviewed hospital note along with CT scan.      Right flank pain   Should improve over time patient passed kidney stone last night.  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  This was explained to patient in office.      Other Visit Diagnoses       Need for Tdap vaccination       Relevant Orders   Tdap vaccine greater than or equal to 7yo IM (Completed)       No orders of the defined types were placed in this encounter.   Return in about 2 months (around 12/15/2023) for CPE and Labs.  Margarie Shay, NP

## 2023-10-15 NOTE — Patient Instructions (Signed)
 Nice to see you today I want to see you in approx 2 months for your physical and labs We will reach out to you once I have completed the [facility] paperwork

## 2023-10-15 NOTE — Assessment & Plan Note (Signed)
 Should improve over time patient passed kidney stone last night.  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  This was explained to patient in office.

## 2023-10-15 NOTE — Assessment & Plan Note (Signed)
 Reviewed hospital note along with CT scan.

## 2024-01-04 ENCOUNTER — Encounter: Admitting: Nurse Practitioner
